# Patient Record
Sex: Female | Born: 1975 | Race: White | Hispanic: No | Marital: Married | Smoking: Current every day smoker
Health system: Southern US, Community
[De-identification: ages and names within clinical notes are randomized; demographics above are authoritative.]

## PROBLEM LIST (undated history)

## (undated) DIAGNOSIS — F329 Major depressive disorder, single episode, unspecified: Secondary | ICD-10-CM

## (undated) DIAGNOSIS — F32A Depression, unspecified: Secondary | ICD-10-CM

## (undated) DIAGNOSIS — F419 Anxiety disorder, unspecified: Secondary | ICD-10-CM

---

## 2007-05-29 ENCOUNTER — Ambulatory Visit: Payer: Self-pay | Admitting: Internal Medicine

## 2007-06-17 ENCOUNTER — Ambulatory Visit: Payer: Self-pay | Admitting: Urology

## 2011-05-04 ENCOUNTER — Ambulatory Visit: Payer: Self-pay | Admitting: Obstetrics and Gynecology

## 2014-05-20 ENCOUNTER — Ambulatory Visit: Payer: Self-pay | Admitting: Physician Assistant

## 2017-10-17 ENCOUNTER — Other Ambulatory Visit: Payer: Self-pay

## 2017-10-17 ENCOUNTER — Encounter: Payer: Self-pay | Admitting: Gynecology

## 2017-10-17 ENCOUNTER — Ambulatory Visit
Admission: EM | Admit: 2017-10-17 | Discharge: 2017-10-17 | Disposition: A | Discharge status: Home / Self Care | Payer: Federal, State, Local not specified - PPO | Attending: Family Medicine | Admitting: Family Medicine

## 2017-10-17 DIAGNOSIS — B9789 Other viral agents as the cause of diseases classified elsewhere: Secondary | ICD-10-CM

## 2017-10-17 DIAGNOSIS — J069 Acute upper respiratory infection, unspecified: Secondary | ICD-10-CM

## 2017-10-17 HISTORY — DX: Anxiety disorder, unspecified: F41.9

## 2017-10-17 HISTORY — DX: Major depressive disorder, single episode, unspecified: F32.9

## 2017-10-17 HISTORY — DX: Depression, unspecified: F32.A

## 2017-10-17 LAB — RAPID INFLUENZA A&B ANTIGENS
Influenza A (ARMC): NEGATIVE
Influenza B (ARMC): NEGATIVE

## 2017-10-17 NOTE — ED Provider Notes (Signed)
[facility]    CSN: [id number] Arrival date & time: 10/17/17  1607     History   Chief Complaint Chief Complaint  Patient presents with  . Cough    HPI Madison Odonnell is a 42 y.o. female.   The history is provided by the patient.  URI  Presenting symptoms: congestion, cough and rhinorrhea   Severity:  Moderate Onset quality:  Sudden Duration:  2 days Timing:  Constant Progression:  Unchanged Chronicity:  New Relieved by:  None tried Ineffective treatments:  None tried Associated symptoms: no headaches, no sinus pain and no wheezing   Risk factors: sick contacts   Risk factors: not elderly, no chronic cardiac disease, no chronic kidney disease, no chronic respiratory disease, no diabetes mellitus, no immunosuppression, no recent illness and no recent travel     Past Medical History:  Diagnosis Date  . Anxiety and depression     There are no active problems to display for this patient.   History reviewed. No pertinent surgical history.  OB History    No data available       Home Medications    Prior to Admission medications   Medication Sig Start Date End Date Taking? Authorizing Provider  benzonatate (TESSALON) 100 MG capsule Take by mouth 3 (three) times daily as needed for cough.   Yes [provider]  buPROPion (WELLBUTRIN) 100 MG tablet Take 100 mg by mouth 2 (two) times daily.   Yes [provider]    Family History Family History  Problem Relation Age of Onset  . Hypertension Mother   . Diabetes Mother   . Heart attack Mother     Social History Social History   Tobacco Use  . Smoking status: Current Every Day Smoker    Packs/day: 0.50  . Smokeless tobacco: Never Used  Substance Use Topics  . Alcohol use: Yes  . Drug use: No     Allergies   Patient has no known allergies.   Review of Systems Review of Systems  HENT: Positive for congestion and rhinorrhea. Negative for sinus pain.     Respiratory: Positive for cough. Negative for wheezing.   Neurological: Negative for headaches.     Physical Exam Triage Vital Signs ED Triage Vitals  Enc Vitals Group     BP 10/17/17 1622 124/60     Pulse Rate 10/17/17 1622 89     Resp --      Temp 10/17/17 1622 99.1 F (37.3 C)     Temp Source 10/17/17 1622 Oral     SpO2 10/17/17 1622 98 %     Weight 10/17/17 1627 132 lb (59.9 kg)     Height 10/17/17 1627 5\' 1"  (1.549 m)     Head Circumference --      Peak Flow --      Pain Score 10/17/17 1627 6     Pain Loc --      Pain Edu? --      Excl. in GC? --    No data found.  Updated Vital Signs BP 124/60 (BP Location: Left Arm)   Pulse 89   Temp 99.1 F (37.3 C) (Oral)   Ht 5\' 1"  (1.549 m)   Wt 132 lb (59.9 kg)   LMP 09/24/2017   SpO2 98%   BMI 24.94 kg/m   Visual Acuity Right Eye Distance:   Left Eye Distance:   Bilateral Distance:    Right Eye Near:   Left Eye Near:  Bilateral Near:     Physical Exam  Constitutional: She appears well-developed and well-nourished. No distress.  HENT:  Head: Normocephalic and atraumatic.  Right Ear: Tympanic membrane, external ear and ear canal normal.  Left Ear: Tympanic membrane, external ear and ear canal normal.  Nose: Rhinorrhea present. No mucosal edema, nose lacerations, sinus tenderness, nasal deformity, septal deviation or nasal septal hematoma. No epistaxis.  No foreign bodies. Right sinus exhibits no maxillary sinus tenderness and no frontal sinus tenderness. Left sinus exhibits no maxillary sinus tenderness and no frontal sinus tenderness.  Mouth/Throat: Uvula is midline, oropharynx is clear and moist and mucous membranes are normal. No oropharyngeal exudate.  Eyes: Conjunctivae are normal. Right eye exhibits no discharge. Left eye exhibits no discharge. No scleral icterus.  Neck: Normal range of motion. Neck supple. No thyromegaly present.  Cardiovascular: Normal rate, regular rhythm and normal heart sounds.   Pulmonary/Chest: Effort normal and breath sounds normal. No stridor. No respiratory distress. She has no wheezes. She has no rales.  Lymphadenopathy:    She has no cervical adenopathy.  Skin: She is not diaphoretic.  Nursing note and vitals reviewed.    UC Treatments / Results  Labs (all labs ordered are listed, but only abnormal results are displayed) Labs Reviewed  RAPID INFLUENZA A&B ANTIGENS ([facility] ONLY)    EKG  EKG Interpretation None       Radiology No results found.  Procedures Procedures (including critical care time)  Medications Ordered in UC Medications - No data to display   Initial Impression / Assessment and Plan / UC Course  I have reviewed the triage vital signs and the nursing notes.  Pertinent labs & imaging results that were available during my care of the patient were reviewed by me and considered in my medical decision making (see chart for details).       Final Clinical Impressions(s) / UC Diagnoses   Final diagnoses:  Viral URI with cough    ED Discharge Orders    None     1. Lab results and diagnosis reviewed with patient 2. rx as per orders above; reviewed possible side effects, interactions, risks and benefits  3. Recommend supportive treatment with rest, fluids, otc meds prn 4. Follow-up prn if symptoms worsen or don't improve  Controlled Substance Prescriptions [place] Controlled Substance Registry consulted? Not Applicable   Payton Mccallum, MD 10/17/17 [phone]

## 2017-10-17 NOTE — ED Triage Notes (Signed)
Patient c/o cough x 2 days ago.Per patient was seen for a sinus infection x 2 weeks ago. Per patient was given an antibiotic and cough  medication Benzoate 100 mg.

## 2018-10-11 ENCOUNTER — Encounter: Payer: Self-pay | Admitting: Podiatry

## 2018-10-11 ENCOUNTER — Ambulatory Visit: Payer: Federal, State, Local not specified - PPO | Admitting: Podiatry

## 2018-10-11 ENCOUNTER — Other Ambulatory Visit: Payer: Self-pay | Admitting: Podiatry

## 2018-10-11 ENCOUNTER — Ambulatory Visit (INDEPENDENT_AMBULATORY_CARE_PROVIDER_SITE_OTHER): Payer: Federal, State, Local not specified - PPO

## 2018-10-11 VITALS — BP 122/77 | HR 81

## 2018-10-11 DIAGNOSIS — M25571 Pain in right ankle and joints of right foot: Secondary | ICD-10-CM

## 2018-10-11 DIAGNOSIS — M659 Synovitis and tenosynovitis, unspecified: Secondary | ICD-10-CM

## 2018-10-11 DIAGNOSIS — M79671 Pain in right foot: Secondary | ICD-10-CM

## 2018-10-11 MED ORDER — MELOXICAM 15 MG PO TABS: 15.0000 mg | ORAL_TABLET | Freq: Every day | ORAL | 1 refills | Status: AC

## 2018-10-11 MED ORDER — METHYLPREDNISOLONE 4 MG PO TBPK: ORAL_TABLET | ORAL | 0 refills | Status: DC

## 2018-10-14 NOTE — Progress Notes (Signed)
   Subjective: Patient presents today for evaluation of intermittent pain to the medial border of the left hallux that began about two years ago. She reports associated thickening and discoloration of the nail. Patient is concerned for possible ingrown nail. She has not done anything for treatment. Wearing shoes and applying pressure to the toe increases the pain.  She also reports sharp, shooting, throbbing pain diffusely over the right foot that has been ongoing for the past few years. She has been elevating the foot for treatment with no significant relief. Walking and being on the foot increases the pain. Patient presents today for further treatment and evaluation.  Past Medical History:  Diagnosis Date  . Anxiety and depression     Objective:  General: Well developed, nourished, in no acute distress, alert and oriented x3   Dermatology: Skin is warm, dry and supple bilateral. Medial border of the left hallux appears to be erythematous with evidence of an ingrowing nail. Pain on palpation noted to the border of the nail fold. The remaining nails appear unremarkable at this time. There are no open sores, lesions.  Vascular: Dorsalis Pedis artery and Posterior Tibial artery pedal pulses palpable. No lower extremity edema noted.   Neruologic: Grossly intact via light touch bilateral.  Musculoskeletal: Pain with palpation noted to the anterior, lateral and medial aspects of the right ankle. Pain on palpation and range of motion to the sinus tarsi of the right foot. Muscular strength within normal limits in all groups bilateral. Normal range of motion noted to all pedal and ankle joints.   Radiographic Exam:  Normal osseous mineralization. Joint spaces preserved. No fracture/dislocation/boney destruction.    Assesement: #1 Paronychia with ingrowing nail medial border left hallux  #2 Right ankle synovitis  #3 right sinus tarsitis   Plan of Care:  1. Patient evaluated. X-Rays reviewed.    2. Injection of 0.5 mLs Celestone Soluspan injected into the right ankle joint.  3. Prescription for Medrol Dose Pak provided to patient. 4. Prescription for Meloxicam provided to patient. 5. CAM boot dispensed.  6. Mechanical debridement of the right great toenail performed using a nail nipper. Filed with dremel without incident. If ingrown not better, we will do a partial permanent nail avulsion procedure.  7. Return to clinic in 4 weeks.    Felecia Shelling, DPM [facility]  Dr. Felecia Shelling, DPM    [address]                                        [place], [place] [address]                Office [phone]  Fax [phone]

## 2018-11-08 ENCOUNTER — Encounter: Payer: Self-pay | Admitting: Podiatry

## 2018-11-08 ENCOUNTER — Ambulatory Visit (INDEPENDENT_AMBULATORY_CARE_PROVIDER_SITE_OTHER): Payer: Federal, State, Local not specified - PPO | Admitting: Podiatry

## 2018-11-08 ENCOUNTER — Other Ambulatory Visit: Payer: Self-pay

## 2018-11-08 DIAGNOSIS — M25571 Pain in right ankle and joints of right foot: Secondary | ICD-10-CM

## 2018-11-08 DIAGNOSIS — M659 Synovitis and tenosynovitis, unspecified: Secondary | ICD-10-CM | POA: Diagnosis not present

## 2018-11-12 NOTE — Progress Notes (Signed)
   Subjective: 43 year old female presenting today for follow up evaluation of right ankle pain. She states the pain is improving and is not as bad as it was previously. She has been using the CAM boot and taking Meloxicam as directed. There are no modifying factors noted. Patient is here for further evaluation and treatment.   Past Medical History:  Diagnosis Date  . Anxiety and depression     Objective:  General: Well developed, nourished, in no acute distress, alert and oriented x3   Dermatology: Skin is warm, dry and supple bilateral. There are no open sores, lesions.  Vascular: Dorsalis Pedis artery and Posterior Tibial artery pedal pulses palpable. No lower extremity edema noted.   Neruologic: Grossly intact via light touch bilateral.  Musculoskeletal: Pain with palpation noted to the anterior, lateral and medial aspects of the right ankle. Pain on palpation and range of motion to the sinus tarsi of the right foot. Muscular strength within normal limits in all groups bilateral. Normal range of motion noted to all pedal and ankle joints.   Assesement: #1 Paronychia with ingrowing nail medial border left hallux - resolved  #2 Right ankle synovitis  #3 right sinus tarsitis   Plan of Care:  1. Patient evaluated.   2. Injection of 0.5 mLs Celestone Soluspan injected into the lateral right ankle joint.  3. Continue taking Meloxicam.  4. Discontinue using CAM boot.  5. Ankle brace dispensed.  6. Return to clinic in 4 weeks. If not better, an MRI will be ordered.     Felecia Shelling, DPM [facility]  Dr. Felecia Shelling, DPM    [address]                                        [place], [place] [address]                Office [phone]  Fax [phone]

## 2018-12-27 ENCOUNTER — Encounter: Payer: Self-pay | Admitting: Podiatry

## 2018-12-27 ENCOUNTER — Ambulatory Visit: Payer: Federal, State, Local not specified - PPO | Admitting: Podiatry

## 2018-12-27 ENCOUNTER — Other Ambulatory Visit: Payer: Self-pay | Admitting: Podiatry

## 2018-12-27 ENCOUNTER — Ambulatory Visit (INDEPENDENT_AMBULATORY_CARE_PROVIDER_SITE_OTHER): Payer: Federal, State, Local not specified - PPO

## 2018-12-27 ENCOUNTER — Other Ambulatory Visit: Payer: Self-pay

## 2018-12-27 VITALS — Temp 98.1°F

## 2018-12-27 DIAGNOSIS — M84374A Stress fracture, right foot, initial encounter for fracture: Secondary | ICD-10-CM | POA: Diagnosis not present

## 2018-12-27 DIAGNOSIS — M659 Synovitis and tenosynovitis, unspecified: Secondary | ICD-10-CM

## 2018-12-27 DIAGNOSIS — M79671 Pain in right foot: Secondary | ICD-10-CM

## 2018-12-27 DIAGNOSIS — M25571 Pain in right ankle and joints of right foot: Secondary | ICD-10-CM

## 2018-12-27 MED ORDER — CYCLOBENZAPRINE HCL 5 MG PO TABS: 5.0000 mg | ORAL_TABLET | Freq: Three times a day (TID) | ORAL | 1 refills | Status: AC | PRN

## 2019-01-14 NOTE — Progress Notes (Signed)
   Subjective: 43 year old female presenting today for follow up evaluation of right ankle pain.  Patient continues to report some right ankle pain.  It is not constant but intermittently painful.  She says there is improvement since last visit.  The brace really helps.   Past Medical History:  Diagnosis Date  . Anxiety and depression     Objective:  General: Well developed, nourished, in no acute distress, alert and oriented x3   Dermatology: Skin is warm, dry and supple bilateral. There are no open sores, lesions.  Vascular: Dorsalis Pedis artery and Posterior Tibial artery pedal pulses palpable. No lower extremity edema noted.   Neruologic: Grossly intact via light touch bilateral.  Musculoskeletal: Pain with palpation noted to the anterior, lateral and medial aspects of the right ankle. Pain on palpation and range of motion to the sinus tarsi of the right foot. Muscular strength within normal limits in all groups bilateral. Normal range of motion noted to all pedal and ankle joints.   Assesement: #1 Paronychia with ingrowing nail medial border left hallux - resolved  #2 Right ankle synovitis-improved #3 right sinus tarsitis  #4 5th metatarsal stress fracture #5 cramping right foot  Plan of Care:  1. Patient evaluated.   2. Injection of 0.5 mLs Celestone Soluspan injected into the sinus tarsi right 3. Continue taking Meloxicam.  4.  Prescription for Flexeril 5 mg nightly 5.  Continue wearing the ankle brace 6.  Return to clinic in 4 weeks   Felecia Shelling, DPM [facility]  Dr. Felecia Shelling, DPM    [address]                                        [place], [place] [address]                Office [phone]  Fax [phone]

## 2019-01-24 ENCOUNTER — Encounter: Payer: Self-pay | Admitting: Podiatry

## 2019-01-24 ENCOUNTER — Ambulatory Visit: Payer: Self-pay

## 2019-01-24 ENCOUNTER — Ambulatory Visit (INDEPENDENT_AMBULATORY_CARE_PROVIDER_SITE_OTHER): Payer: Federal, State, Local not specified - PPO | Admitting: Podiatry

## 2019-01-24 ENCOUNTER — Other Ambulatory Visit: Payer: Self-pay

## 2019-01-24 ENCOUNTER — Other Ambulatory Visit: Payer: Self-pay | Admitting: Podiatry

## 2019-01-24 VITALS — Temp 97.8°F | Resp 16

## 2019-01-24 DIAGNOSIS — M84374A Stress fracture, right foot, initial encounter for fracture: Secondary | ICD-10-CM | POA: Diagnosis not present

## 2019-01-24 DIAGNOSIS — M79671 Pain in right foot: Secondary | ICD-10-CM | POA: Diagnosis not present

## 2019-01-24 DIAGNOSIS — M659 Synovitis and tenosynovitis, unspecified: Secondary | ICD-10-CM | POA: Diagnosis not present

## 2019-01-24 DIAGNOSIS — M25571 Pain in right ankle and joints of right foot: Secondary | ICD-10-CM | POA: Diagnosis not present

## 2019-01-27 NOTE — Progress Notes (Signed)
   HPI: 43 year old female presenting today for follow up evaluation of right foot pain. She states the pain has not changed since her last visit. She reports moderate dull, shooting pain. She has been using the ankle brace and taking Meloxicam and Flexeril for treatment. There are no modifying factors noted. Patient is here for further evaluation and treatment.   Past Medical History:  Diagnosis Date  . Anxiety and depression      Physical Exam: General: The patient is alert and oriented x3 in no acute distress.  Dermatology: Skin is warm, dry and supple bilateral lower extremities. Negative for open lesions or macerations.  Vascular: Palpable pedal pulses bilaterally. No edema or erythema noted. Capillary refill within normal limits.  Neurological: Epicritic and protective threshold grossly intact bilaterally.   Musculoskeletal Exam: Pain with palpation noted to the 5th metatarsal of the right foot. Range of motion within normal limits to all pedal and ankle joints bilateral. Muscle strength 5/5 in all groups bilateral.   Radiographic Exam:  Normal osseous mineralization. Joint spaces preserved. No fracture/dislocation/boney destruction.    Assessment: 1. 5th metatarsal stress fracture right 2. Cramping right foot   Plan of Care:  1. Patient evaluated. X-Rays reviewed.  2. Order for MRI right foot placed.  3. Continue taking Meloxicam.  4. Return to clinic in 3 weeks to review MRI.      Edrick Kins, DPM [facility]  Dr. Edrick Kins, DPM    [address], [place] [address]                Office [phone]  Fax [phone]

## 2019-01-31 ENCOUNTER — Telehealth: Payer: Self-pay

## 2019-01-31 DIAGNOSIS — M25571 Pain in right ankle and joints of right foot: Secondary | ICD-10-CM

## 2019-01-31 DIAGNOSIS — M84374A Stress fracture, right foot, initial encounter for fracture: Secondary | ICD-10-CM

## 2019-01-31 NOTE — Telephone Encounter (Signed)
Per Dewayne Shorter ([employer]), no precert is required for outpatient services.    Patient has been notified via voice mail and informed to call scheduling to set up own appt at her convenience.

## 2019-01-31 NOTE — Telephone Encounter (Signed)
-----   Message from Edrick Kins, DPM sent at 01/24/2019  1:49 PM EDT ----- Regarding: MRI RT foot Please order MRI RT foot.   Dx: sinus tarsitis RT. 5th metatarsal pain right.

## 2019-02-14 ENCOUNTER — Ambulatory Visit: Payer: Federal, State, Local not specified - PPO | Admitting: Podiatry

## 2019-02-16 ENCOUNTER — Ambulatory Visit
Admission: RE | Admit: 2019-02-16 | Discharge: 2019-02-16 | Disposition: A | Discharge status: Home / Self Care | Payer: Federal, State, Local not specified - PPO | Source: Ambulatory Visit | Attending: Podiatry | Admitting: Podiatry

## 2019-02-16 ENCOUNTER — Other Ambulatory Visit: Payer: Self-pay

## 2019-02-16 DIAGNOSIS — M84374A Stress fracture, right foot, initial encounter for fracture: Secondary | ICD-10-CM | POA: Diagnosis present

## 2019-02-16 DIAGNOSIS — M25571 Pain in right ankle and joints of right foot: Secondary | ICD-10-CM | POA: Insufficient documentation

## 2019-02-25 ENCOUNTER — Encounter: Payer: Self-pay | Admitting: Podiatry

## 2019-02-25 ENCOUNTER — Other Ambulatory Visit: Payer: Self-pay

## 2019-02-25 ENCOUNTER — Ambulatory Visit (INDEPENDENT_AMBULATORY_CARE_PROVIDER_SITE_OTHER): Payer: Federal, State, Local not specified - PPO | Admitting: Podiatry

## 2019-02-25 VITALS — Temp 97.3°F

## 2019-02-25 DIAGNOSIS — G5791 Unspecified mononeuropathy of right lower limb: Secondary | ICD-10-CM | POA: Diagnosis not present

## 2019-02-25 MED ORDER — GABAPENTIN 100 MG PO CAPS: 100.0000 mg | ORAL_CAPSULE | Freq: Three times a day (TID) | ORAL | 1 refills | Status: DC

## 2019-02-27 NOTE — Progress Notes (Signed)
   HPI: 43 y.o. female presenting today for follow up evaluation of a stress fracture of the 5th metatarsal of the right foot. She reports continued pain that is more constant today. There are no modifying factors noted. She has been taking Meloxicam for treatment. Patient is here for further evaluation and treatment.   Past Medical History:  Diagnosis Date  . Anxiety and depression      Physical Exam: General: The patient is alert and oriented x3 in no acute distress.  Dermatology: Skin is warm, dry and supple bilateral lower extremities. Negative for open lesions or macerations.  Vascular: Palpable pedal pulses bilaterally. No edema or erythema noted. Capillary refill within normal limits.  Neurological: Epicritic and protective threshold intact.  Paresthesia with burning, shooting pain noted to the dorsolateral right foot.   Musculoskeletal Exam: Positive Tinel sign with pain on palpation noted to the right dorsolateral foot. Range of motion within normal limits to all pedal and ankle joints bilateral. Muscle strength 5/5 in all groups bilateral.   MRI Impression:    1. 3.4 cm ganglion cyst in the dorsolateral midfoot.  Assessment: 1. Neuritis right dorsolateral foot 2. Ganglion cyst right as per MRI   Plan of Care:  1. Patient evaluated. MRI reviewed.  2. Injection of 0.5 mLs Celestone Soluspan injected into the right dorsolateral foot.  3. Prescription for Gabapentin 100 mg TID provided to patient.  4. Recommended good shoe gear.  5. Return to clinic in 4 weeks.      Edrick Kins, DPM [facility]  Dr. Edrick Kins, DPM    [address], [place] [address]                Office [phone]  Fax [phone]

## 2019-03-19 ENCOUNTER — Other Ambulatory Visit: Payer: Self-pay | Admitting: Podiatry

## 2019-03-28 ENCOUNTER — Encounter: Payer: Self-pay | Admitting: Podiatry

## 2019-03-28 ENCOUNTER — Ambulatory Visit: Payer: Federal, State, Local not specified - PPO | Admitting: Podiatry

## 2019-03-28 ENCOUNTER — Other Ambulatory Visit: Payer: Self-pay

## 2019-03-28 VITALS — Temp 97.8°F

## 2019-03-28 DIAGNOSIS — M25571 Pain in right ankle and joints of right foot: Secondary | ICD-10-CM | POA: Diagnosis not present

## 2019-03-28 DIAGNOSIS — G5791 Unspecified mononeuropathy of right lower limb: Secondary | ICD-10-CM

## 2019-03-30 NOTE — Progress Notes (Signed)
   HPI: 43 y.o. female presenting today for follow up evaluation of neuritis and a ganglion cyst of the right foot. She states she is still experiencing some pain but it is not as severe as it was previously. She has been taking Gabapentin daily for treatment. There are no modifying factors noted. Patient is here for further evaluation and treatment.   Past Medical History:  Diagnosis Date  . Anxiety and depression      Physical Exam: General: The patient is alert and oriented x3 in no acute distress.  Dermatology: Skin is warm, dry and supple bilateral lower extremities. Negative for open lesions or macerations.  Vascular: Palpable pedal pulses bilaterally. No edema or erythema noted. Capillary refill within normal limits.  Neurological: Epicritic and protective threshold intact.  Paresthesia with burning, shooting pain noted to the dorsolateral right foot.   Musculoskeletal Exam: Positive Tinel sign with pain on palpation noted to the right dorsolateral foot. Range of motion within normal limits to all pedal and ankle joints bilateral. Muscle strength 5/5 in all groups bilateral.   Assessment: 1. Neuritis right dorsolateral foot - improved  2. Ganglion cyst right as per MRI - improved    Plan of Care:  1. Patient evaluated.  2. Continue taking Gabapentin daily.  3. Continue wearing good shoe gear.  4. Patient declined topical antiinflammatory prescription.  5. Return to clinic as needed.      Edrick Kins, DPM [facility]  Dr. Edrick Kins, DPM    [address], [place] [address]                Office [phone]  Fax [phone]

## 2019-05-15 ENCOUNTER — Ambulatory Visit
Admission: EM | Admit: 2019-05-15 | Discharge: 2019-05-15 | Disposition: A | Discharge status: Home / Self Care | Payer: Federal, State, Local not specified - PPO | Attending: Family Medicine | Admitting: Family Medicine

## 2019-05-15 ENCOUNTER — Encounter: Payer: Self-pay | Admitting: Emergency Medicine

## 2019-05-15 ENCOUNTER — Other Ambulatory Visit: Payer: Self-pay

## 2019-05-15 DIAGNOSIS — N390 Urinary tract infection, site not specified: Secondary | ICD-10-CM

## 2019-05-15 DIAGNOSIS — N760 Acute vaginitis: Secondary | ICD-10-CM | POA: Diagnosis not present

## 2019-05-15 DIAGNOSIS — B9689 Other specified bacterial agents as the cause of diseases classified elsewhere: Secondary | ICD-10-CM | POA: Diagnosis not present

## 2019-05-15 LAB — URINALYSIS, COMPLETE (UACMP) WITH MICROSCOPIC
Bilirubin Urine: NEGATIVE
Glucose, UA: NEGATIVE mg/dL
Nitrite: NEGATIVE
Protein, ur: NEGATIVE mg/dL
Specific Gravity, Urine: 1.01 (ref 1.005–1.030)
WBC, UA: >50 WBC/hpf (ref 0–5)
pH: 7 (ref 5.0–8.0)

## 2019-05-15 LAB — WET PREP, GENITAL
Sperm: NONE SEEN
Trich, Wet Prep: NONE SEEN
Yeast Wet Prep HPF POC: NONE SEEN

## 2019-05-15 MED ORDER — METRONIDAZOLE 500 MG PO TABS: 500.0000 mg | ORAL_TABLET | Freq: Two times a day (BID) | ORAL | 0 refills | Status: AC

## 2019-05-15 MED ORDER — PHENAZOPYRIDINE HCL 200 MG PO TABS: 200.0000 mg | ORAL_TABLET | Freq: Three times a day (TID) | ORAL | 0 refills | Status: AC | PRN

## 2019-05-15 MED ORDER — SULFAMETHOXAZOLE-TRIMETHOPRIM 800-160 MG PO TABS: 1.0000 | ORAL_TABLET | Freq: Two times a day (BID) | ORAL | 0 refills | Status: AC

## 2019-05-15 NOTE — ED Provider Notes (Signed)
[place], [place]   Name: Madison Murrayrin Macallum Cutshaw DOB: [birth year] MRN: [id number] CSN: [id number] PCP: Jenell MillinerLuyando, Yvonne, MD  Arrival date and time:  05/15/19 1310  Chief Complaint:  Urinary symptoms and vaginal discharge  NOTE: Prior to seeing the patient today, I have reviewed the triage nursing documentation and vital signs. Clinical staff has updated patient's PMH/PSHx, current medication list, and drug allergies/intolerances to ensure comprehensive history available to assist in medical decision making.   History:   HPI: Madison Odonnell is a 43 y.o. female who presents today with complaints of urinary symptoms that began last week when she mainly had urgency. Patient reports that she drank lots of water and cranberry juice, which abated her symptoms. Yesterday, patient developed a recurrence of her symptoms. She complains of dysuria, frequency, and urgency. She has not appreciated any gross hematuria, nor has she noticed her urine being malodorous. Patient denies any associated nausea, vomiting, fever, and chills. She has not experienced any pain in her lower back, flank area, or abdomen. Patient advises that she has a significant history for recurrent urinary tract infections. She denies any vaginal pain or bleeding. She does, however report white vaginal discharge with no associated odor. There are no concerns that she is currently pregnant as she is perimenopausal.   Past Medical History:  Diagnosis Date  . Anxiety and depression     History reviewed. No pertinent surgical history.  Family History  Problem Relation Age of Onset  . Hypertension Mother   . Diabetes Mother   . Heart attack Mother     Social History   Tobacco Use  . Smoking status: Current Every Day Smoker    Packs/day: 0.50  . Smokeless tobacco: Never Used  Substance Use Topics  . Alcohol use: Yes  . Drug use: No    There are no active problems to display for this patient.   Home Medications:    Current  Meds  Medication Sig  . buPROPion (WELLBUTRIN) 100 MG tablet Take 100 mg by mouth 2 (two) times daily.  Marland Kitchen. gabapentin (NEURONTIN) 100 MG capsule TAKE 1 CAPSULE BY MOUTH THREE TIMES A DAY    Allergies:   Patient has no known allergies.  Review of Systems (ROS): Review of Systems  Constitutional: Negative for chills and fever.  Respiratory: Negative for cough and shortness of breath.   Cardiovascular: Negative for chest pain and palpitations.  Gastrointestinal: Negative for abdominal pain, nausea and vomiting.  Genitourinary: Positive for dysuria, frequency, urgency and vaginal discharge. Negative for decreased urine volume, hematuria, pelvic pain, vaginal bleeding and vaginal pain.  Musculoskeletal: Negative for back pain.  Skin: Negative for color change, pallor and rash.  Neurological: Negative for dizziness, syncope, weakness and headaches.  All other systems reviewed and are negative.    Vital Signs: Today's Vitals   05/15/19 1331 05/15/19 1336 05/15/19 1447  BP:  105/64   Pulse:  71   Resp:  18   Temp:  98.4 F (36.9 C)   SpO2:  100%   Height: 5\' 1"  (1.549 m)    PainSc: 7   4     Physical Exam: Physical Exam  Constitutional: She is oriented to person, place, and time and well-developed, well-nourished, and in no distress.  HENT:  Head: Normocephalic and atraumatic.  Mouth/Throat: Mucous membranes are normal.  Eyes: Pupils are equal, round, and reactive to light.  Neck: Normal range of motion. Neck supple.  Cardiovascular: Normal rate, regular rhythm, normal heart sounds and intact  distal pulses. Exam reveals no gallop and no friction rub.  No murmur heard. Pulmonary/Chest: Effort normal and breath sounds normal. No respiratory distress. She has no wheezes. She has no rales.  Abdominal: Soft. Normal appearance and bowel sounds are normal. There is abdominal tenderness in the suprapubic area. There is no CVA tenderness.  Genitourinary:    Genitourinary Comments: Exam  deferred. No vaginal/pelvic pain or bleeding. Patient is not currently pregnant. She has elected to self collect specimen swab for wet prep.   Neurological: She is alert and oriented to person, place, and time. Gait normal.  Skin: Skin is warm and dry. No rash noted.  Psychiatric: Mood, memory, affect and judgment normal.  Nursing note and vitals reviewed.   Urgent Care Treatments / Results:   LABS: PLEASE NOTE: all labs that were ordered this encounter are listed, however only abnormal results are displayed. Labs Reviewed  WET PREP, GENITAL - Abnormal; Notable for the following components:      Result Value   Clue Cells Wet Prep HPF POC PRESENT (*)    WBC, Wet Prep HPF POC FEW (*)    All other components within normal limits  URINALYSIS, COMPLETE (UACMP) WITH MICROSCOPIC - Abnormal; Notable for the following components:   APPearance CLOUDY (*)    Hgb urine dipstick SMALL (*)    Ketones, ur TRACE (*)    Leukocytes,Ua MODERATE (*)    Bacteria, UA RARE (*)    All other components within normal limits  URINE CULTURE    EKG: -None  RADIOLOGY: No results found.  PROCEDURES: Procedures  MEDICATIONS RECEIVED THIS VISIT: Medications - No data to display  PERTINENT CLINICAL COURSE NOTES/UPDATES:   Initial Impression / Assessment and Plan / Urgent Care Course:  Pertinent labs & imaging results that were available during my care of the patient were personally reviewed by me and considered in my medical decision making (see lab/imaging section of note for values and interpretations).  Madison Odonnell is a 43 y.o. female who presents to [facility] today with complaints of urinary symptoms and vaginal discharge.   Patient is well appearing overall in clinic today. She does not appear to be in any acute distress. Presenting symptoms (see HPI) and exam as documented above. Wet prep (+) for clue cells, which is consistent with a diagnosis of bacterial vaginosis (BV). Will  prescribe a 7 day course of oral metronidazole. Patient educated on need to complete full course of this medication even if feeling better. Reviewed need to avoid alcohol while on this medication, as concurrent use can cause a disulfiram like reaction, which in turn will cause her to experience significant nausea and vomiting.   UA was (+) for infection; reflex culture sent. Will treat with a 5 day course of Bactrim. Patient encouraged to complete the entire course of antibiotics even if she begins to feel better. She was advised that if culture demonstrates resistance to the prescribed antibiotic, she will be contacted and advised of the need to change the antibiotic being used to treat her infection. Patient encouraged to increase her fluid intake as much as possible. Discussed that water is always best to flush the urinary tract. She was advised to avoid caffeine containing fluids until her infections clears, as caffeine can cause her to experience painful bladder spasms. May use Tylenol and/or Ibuprofen as needed for pain/fever. Rx sent in for phenazopyridine to help relieve her current urinary pain.   Discussed follow up with primary care physician  in 1 week for re-evaluation. I have reviewed the follow up and strict return precautions for any new or worsening symptoms. Patient is aware of symptoms that would be deemed urgent/emergent, and would thus require further evaluation either here or in the emergency department. At the time of discharge, she verbalized understanding and consent with the discharge plan as it was reviewed with her. All questions were fielded by provider and/or clinic staff prior to patient discharge.    Final Clinical Impressions / Urgent Care Diagnoses:   Final diagnoses:  Urinary tract infection without hematuria, site unspecified  BV (bacterial vaginosis)    New Prescriptions:  [place] Controlled Substance Registry consulted? Not Applicable  Meds ordered this encounter   Medications  . sulfamethoxazole-trimethoprim (BACTRIM DS) 800-160 MG tablet    Sig: Take 1 tablet by mouth 2 (two) times daily for 5 days.    Dispense:  10 tablet    Refill:  0  . metroNIDAZOLE (FLAGYL) 500 MG tablet    Sig: Take 1 tablet (500 mg total) by mouth 2 (two) times daily.    Dispense:  14 tablet    Refill:  0  . phenazopyridine (PYRIDIUM) 200 MG tablet    Sig: Take 1 tablet (200 mg total) by mouth 3 (three) times daily as needed for pain.    Dispense:  9 tablet    Refill:  0    Recommended Follow up Care:  Patient encouraged to follow up with the following provider within the specified time frame, or sooner as dictated by the severity of her symptoms. As always, she was instructed that for any urgent/emergent care needs, she should seek care either here or in the emergency department for more immediate evaluation.  Follow-up Information    Danae Orleans, MD In 1 week.   Specialty: Internal Medicine Why: General reassessment of symptoms if not improving Contact information: [address] [facility] [place] [address] [phone]         NOTE: This note was prepared using Dragon dictation software along with smaller phrase technology. Despite my best ability to proofread, there is the potential that transcriptional errors may still occur from this process, and are completely unintentional.   Karen Kitchens, NP 05/16/19 2119

## 2019-05-15 NOTE — ED Triage Notes (Signed)
Patient here today? UTI haad burning this morning and yesterday discharge(white) Burt Ek. Last wk patient stated that she  had urgency drunk plenty of  cranberrry juice and water went away.

## 2019-05-15 NOTE — Discharge Instructions (Addendum)
It was very nice seeing you today in clinic. Thank you for entrusting me with your care.   As discussed, your urine and vaginal swab were POSITIVE for infection. Will approach treatment as follows:  Prescription has been sent to your pharmacy for antibiotics.  Please pick up and take as directed. FINISH the entire course of medication even if you are feeling better.  A culture will be sent on your provided sample. If it comes back resistant to what I have prescribed you, someone will call you and let you know that we will need to change antibiotics. Increase fluid intake as much as possible to flush your urinary tract.  Water is always the best.  Avoid caffeine until your infection clears up, as it can contribute to painful bladder spasms.  May use Tylenol and/or Ibuprofen as needed for pain/fever.  Make arrangements to follow up with your regular doctor in 1 week for re-evaluation. If your symptoms/condition worsens, please seek follow up care either here or in the ER. Please remember, our [place] providers are "right here with you" when you need [place].   Again, it was my pleasure to take care of you today. Thank you for choosing our clinic. I hope that you start to feel better quickly.   Honor Loh, MSN, APRN, FNP-C, CEN Advanced Practice Provider [facility]

## 2019-05-17 LAB — URINE CULTURE: Culture: 50000 — AB

## 2019-05-19 ENCOUNTER — Telehealth (HOSPITAL_COMMUNITY): Payer: Self-pay | Admitting: Emergency Medicine

## 2019-05-19 NOTE — Telephone Encounter (Signed)
Urine culture was positive for e coli and was given bactrim  at urgent care visit.. Attempted to reach patient. No answer at this time.   

## 2019-07-08 ENCOUNTER — Other Ambulatory Visit: Payer: Self-pay

## 2019-07-08 DIAGNOSIS — Z20822 Contact with and (suspected) exposure to covid-19: Secondary | ICD-10-CM

## 2019-07-10 LAB — NOVEL CORONAVIRUS, NAA: SARS-CoV-2, NAA: NOT DETECTED

## 2020-11-13 IMAGING — MR MRI OF THE RIGHT FOREFOOT WITHOUT CONTRAST
5 series · 40 of 40 positions shown · non-contrast
Comparison: Right foot x-rays dated January 24, 2019.

CLINICAL DATA: Intermittent lateral right foot pain for the past
year.

EXAM:
MRI OF THE RIGHT FOREFOOT WITHOUT CONTRAST
TECHNIQUE: Multiplanar, multisequence MR imaging of the right foot was
performed. No intravenous contrast was administered.

[Series 3: T1 · coronal · right · 3.0mm · 0.38mm/px · 10 of 45 slices shown (1 of 2)]
[im 1/45]
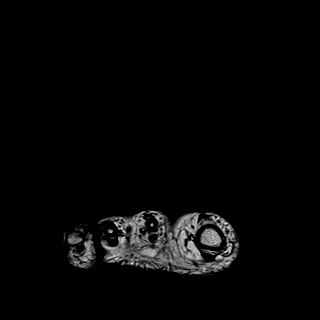
[im 5/45]
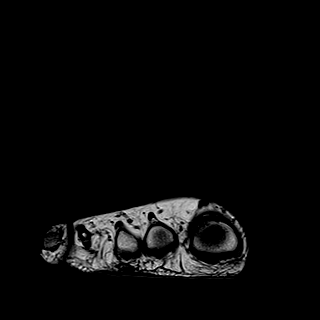
[im 10/45]
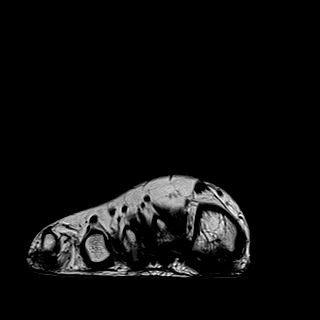
[im 15/45]
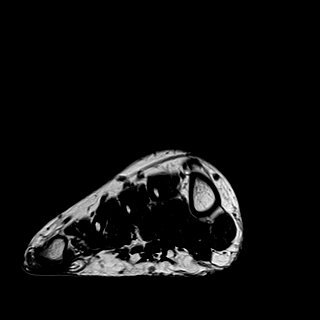
[im 20/45]
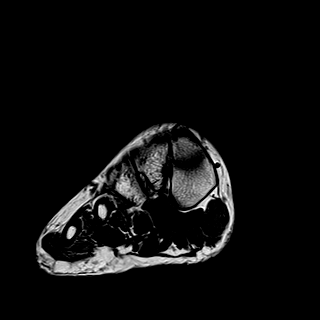
[im 25/45]
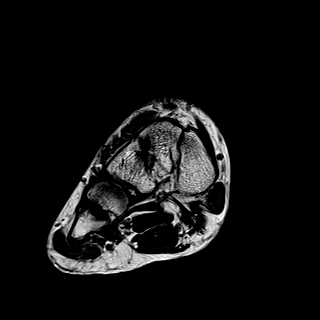
[im 30/45]
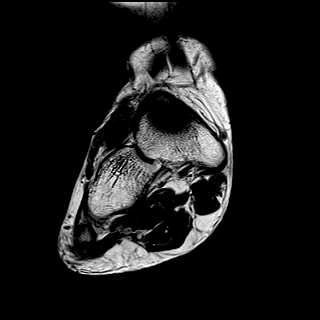
[im 35/45]
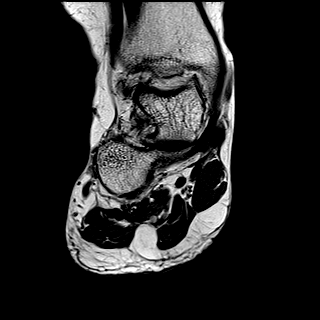
[im 40/45]
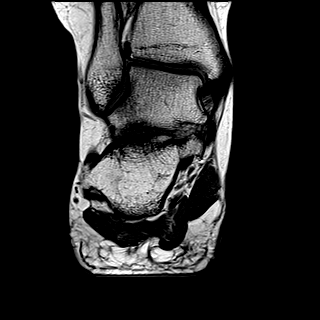
[im 45/45]
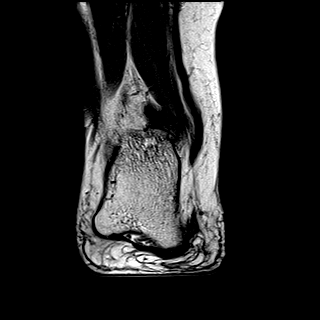

[Series 5: T2 · coronal · right · 3.0mm · 0.38mm/px · 11 of 45 slices shown (1 of 2)]
[im 1/45]
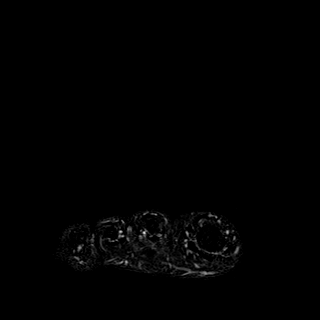
[im 5/45]
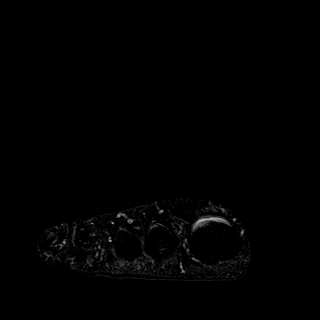
[im 9/45]
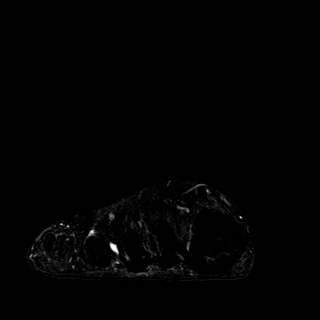
[im 14/45]
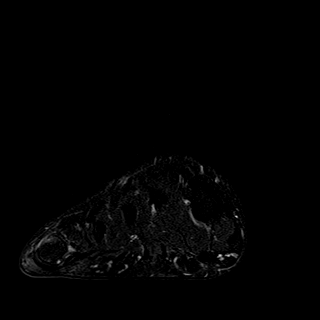
[im 18/45]
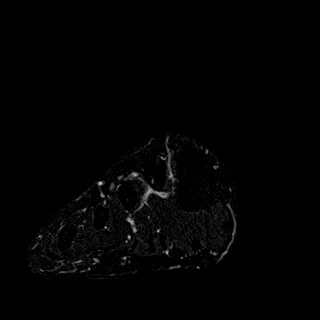
[im 23/45]
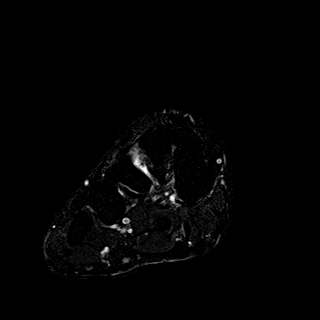
[im 27/45]
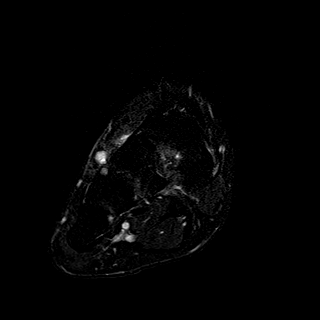
[im 31/45]
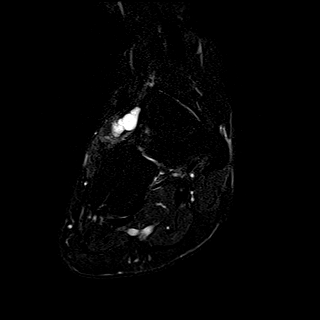
[im 36/45]
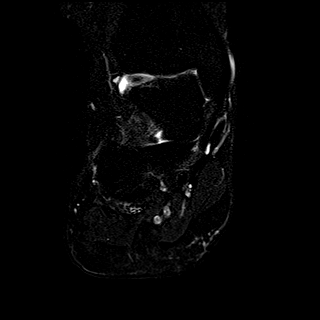
[im 40/45]
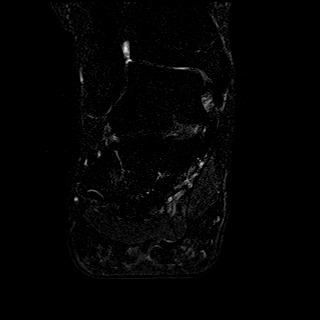
[im 45/45]
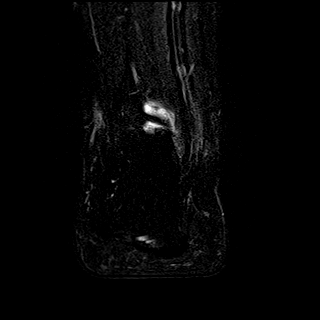

[Series 6: T1 · axial · right · 3.0mm · 0.70mm/px · z∈[-121,-36]mm · 6 of 24 slices shown (2 of 2)]
[im 1/24]
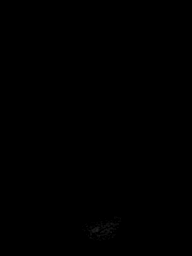
[im 5/24]
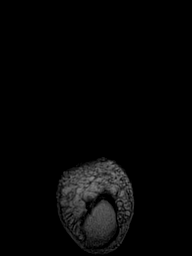
[im 10/24]
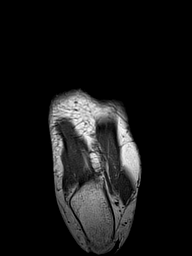
[im 14/24]
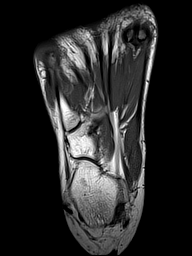
[im 19/24]
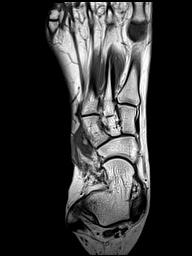
[im 24/24]
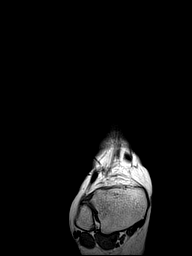

[Series 8: T2 · axial · right · 3.0mm · 0.70mm/px · z∈[-121,-36]mm · 6 of 23 slices shown (2 of 2)]
[im 1/23]
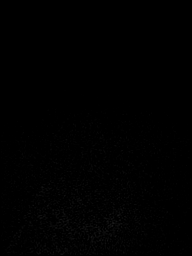
[im 5/23]
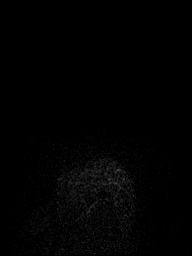
[im 9/23]
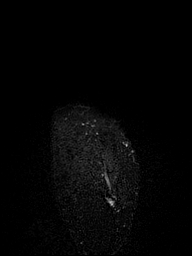
[im 14/23]
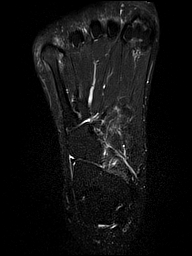
[im 18/23]
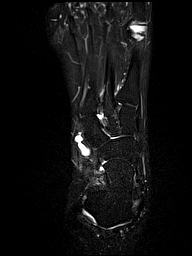
[im 23/23]
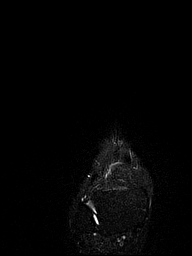

[Series 9: STIR · sagittal · right · 3.0mm · 0.62mm/px · 7 of 28 slices shown]
[im 1/28]
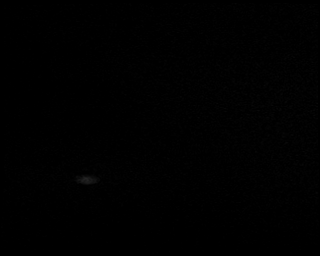
[im 5/28]
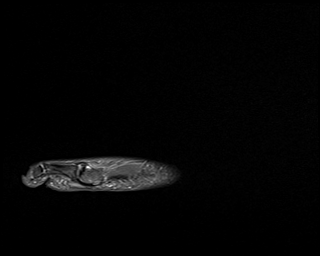
[im 10/28]
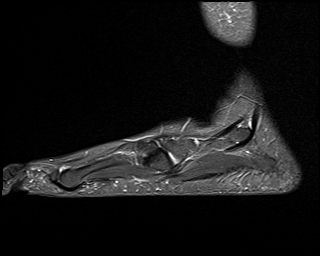
[im 14/28]
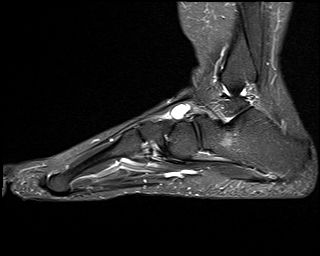
[im 19/28]
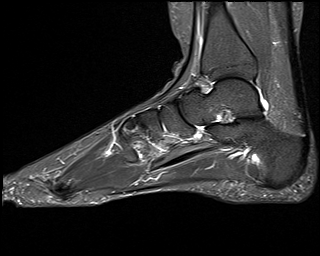
[im 23/28]
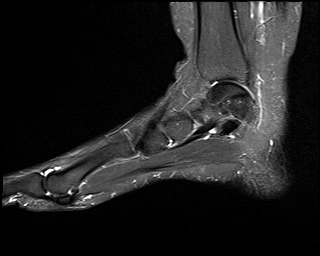
[im 28/28]
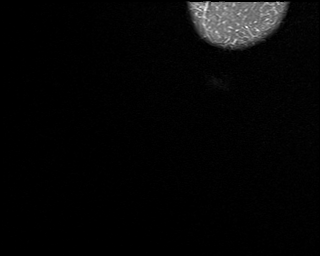

[40 of 40 positions shown; findings below may reference images not displayed]

FINDINGS: Bones/Joint/Cartilage

No marrow signal abnormality. No fracture or dislocation. Normal
alignment. No joint effusion.

Ligaments

Collateral ligaments are intact.  Lisfranc ligament is intact.

Muscles and Tendons
Flexor, peroneal and extensor compartment tendons are intact. No
muscle edema or atrophy.

Soft tissue

There is a 3.4 x 1.0 x 0.9 cm ganglion cyst in the dorsolateral
midfoot extending from the talonavicular joint to the fourth
tarsometatarsal joint. This may arise from the fourth
tarsometatarsal joint (series 9, image 13). The sinus tarsi is
unremarkable.
IMPRESSION: 1. 3.4 cm ganglion cyst in the dorsolateral midfoot.

## 2022-01-20 ENCOUNTER — Encounter: Payer: Self-pay | Admitting: Podiatry

## 2022-01-24 ENCOUNTER — Ambulatory Visit (INDEPENDENT_AMBULATORY_CARE_PROVIDER_SITE_OTHER): Payer: Federal, State, Local not specified - PPO

## 2022-01-24 ENCOUNTER — Ambulatory Visit: Payer: Federal, State, Local not specified - PPO | Admitting: Podiatry

## 2022-01-24 DIAGNOSIS — L723 Sebaceous cyst: Secondary | ICD-10-CM

## 2022-01-24 DIAGNOSIS — M778 Other enthesopathies, not elsewhere classified: Secondary | ICD-10-CM | POA: Diagnosis not present

## 2022-01-24 MED ORDER — MELOXICAM 15 MG PO TABS: 15.0000 mg | ORAL_TABLET | Freq: Every day | ORAL | 1 refills | Status: AC

## 2022-01-24 NOTE — Progress Notes (Signed)
   HPI: 46 y.o. female presenting today for new complaint of pain to the right foot.  Patient states that she went to a preschool graduation and noticed increased pain and tenderness that day.  By the end of the day she did notice swelling and tenderness to the right foot dorsal aspect.  She denies injury.  She says that this occurred about 3 weeks ago and over the past 3 weeks there has been improvement.  The swelling has resolved.  She continues to have some slight tenderness to the area.  She presents for further treatment and evaluation  Past Medical History:  Diagnosis Date   Anxiety and depression     No past surgical history on file.  No Known Allergies   Physical Exam: General: The patient is alert and oriented x3 in no acute distress.  Dermatology: Skin is warm, dry and supple bilateral lower extremities. Negative for open lesions or macerations.  There is a small focal bruise overlying the distal portion of the fourth metatarsal.  The patient was unaware.  Associated tenderness to palpation in this area  Vascular: Palpable pedal pulses bilaterally. Capillary refill within normal limits.  Negative for any significant edema or erythema  Neurological: Light touch and protective threshold grossly intact  Musculoskeletal Exam: No pedal deformities noted.  Pain on palpation noted most focused around the contusion to the dorsal aspect of the right forefoot overlying the fourth metatarsal.  Radiographic Exam:  Normal osseous mineralization. Joint spaces preserved. No fracture/dislocation/boney destruction.    Assessment: 1.  Contusion right forefoot 2.  Capsulitis right foot   Plan of Care:  1. Patient evaluated. X-Rays reviewed.  2.  Fortunately the patient has had improvement over the past 3 weeks.  We will simply continue some conservative treatment for now 3.  Continue good supportive shoes and sneakers.  I did recommend OTC arch supports.  Patient cannot afford custom molded  orthotics 4.  Prescription for meloxicam 15 mg daily 5.  Return to clinic as needed      Felecia Shelling, DPM [facility]  Dr. Felecia Shelling, DPM    [address] [place], [place] [address]                Office [phone]  Fax [phone]

## 2022-09-04 ENCOUNTER — Ambulatory Visit
Admission: EM | Admit: 2022-09-04 | Discharge: 2022-09-04 | Disposition: A | Discharge status: Home / Self Care | Payer: Federal, State, Local not specified - PPO | Attending: Physician Assistant | Admitting: Physician Assistant

## 2022-09-04 DIAGNOSIS — G43809 Other migraine, not intractable, without status migrainosus: Secondary | ICD-10-CM | POA: Diagnosis not present

## 2022-09-04 DIAGNOSIS — L02214 Cutaneous abscess of groin: Secondary | ICD-10-CM

## 2022-09-04 MED ORDER — SULFAMETHOXAZOLE-TRIMETHOPRIM 800-160 MG PO TABS: 1.0000 | ORAL_TABLET | Freq: Two times a day (BID) | ORAL | 0 refills | Status: AC

## 2022-09-04 MED ORDER — KETOROLAC TROMETHAMINE 60 MG/2ML IM SOLN
30.0000 mg | Freq: Once | INTRAMUSCULAR | Status: AC
  Administered 2022-09-04: 30 mg via INTRAMUSCULAR

## 2022-09-04 NOTE — ED Provider Notes (Signed)
[facility]    CSN: [id number] Arrival date & time: 09/04/22  1300      History   Chief Complaint Chief Complaint  Patient presents with   Headache    HPI Madison Odonnell is a 47 y.o. female presenting for migraine headaches that have been coming and going for the past 3 to 4 days.  She says they do get better with NSAIDs but she is concerned that the doxycycline she is taking for a groin abscess has caused the migraines.  She says she has not had migraines since she was in her 2s.  She does have a history of thumbnail.  She says she gets photophobia and spotty vision when the migraine comes on.  She currently has a headache but says is a little better than it was earlier.  She has not taken anything for the migraine today.  She has never taken doxycycline before and thinks that could be responsible for this as noted.  She has missed last couple doses of the doxycycline.  She says she thinks the abscess has not gotten better and possibly has gotten a little worse.  No other concerns.  HPI  Past Medical History:  Diagnosis Date   Anxiety and depression     There are no problems to display for this patient.   History reviewed. No pertinent surgical history.  OB History   No obstetric history on file.      Home Medications    Prior to Admission medications   Medication Sig Start Date End Date Taking? Authorizing Provider  sulfamethoxazole-trimethoprim (BACTRIM DS) 800-160 MG tablet Take 1 tablet by mouth 2 (two) times daily for 7 days. 09/04/22 09/11/22 Yes Danton Clap, PA-C  buPROPion (WELLBUTRIN) 100 MG tablet Take 100 mg by mouth 2 (two) times daily.    [provider]  cyclobenzaprine (FLEXERIL) 5 MG tablet Take 1 tablet (5 mg total) by mouth 3 (three) times daily as needed for muscle spasms. 12/27/18   Edrick Kins, DPM  gabapentin (NEURONTIN) 100 MG capsule TAKE 1 CAPSULE BY MOUTH THREE TIMES A DAY 03/19/19   Edrick Kins, DPM  meloxicam  (MOBIC) 15 MG tablet Take 1 tablet (15 mg total) by mouth daily. 01/24/22   Edrick Kins, DPM  metroNIDAZOLE (FLAGYL) 500 MG tablet Take 1 tablet (500 mg total) by mouth 2 (two) times daily. 05/15/19   Karen Kitchens, NP  phenazopyridine (PYRIDIUM) 200 MG tablet Take 1 tablet (200 mg total) by mouth 3 (three) times daily as needed for pain. 05/15/19   Karen Kitchens, NP    Family History Family History  Problem Relation Age of Onset   Hypertension Mother    Diabetes Mother    Heart attack Mother     Social History Social History   Tobacco Use   Smoking status: Every Day    Packs/day: 0.50    Types: Cigarettes   Smokeless tobacco: Never  Substance Use Topics   Alcohol use: Yes   Drug use: No     Allergies   Patient has no known allergies.   Review of Systems Review of Systems  Constitutional:  Negative for fatigue and fever.  Eyes:  Positive for photophobia and visual disturbance.  Gastrointestinal:  Negative for abdominal pain.  Genitourinary:  Negative for vaginal pain.  Musculoskeletal:  Negative for neck pain.  Skin:  Positive for color change.  Neurological:  Positive for headaches. Negative for dizziness and weakness.  Physical Exam Triage Vital Signs ED Triage Vitals [09/04/22 1323]  Enc Vitals Group     BP (!) 156/98     Pulse Rate 98     Resp 16     Temp 98.5 F (36.9 C)     Temp Source Oral     SpO2 100 %     Weight      Height      Head Circumference      Peak Flow      Pain Score      Pain Loc      Pain Edu?      Excl. in GC?    No data found.  Updated Vital Signs BP (!) 156/98 (BP Location: Left Arm)   Pulse 98   Temp 98.5 F (36.9 C) (Oral)   Resp 16   SpO2 100%      Physical Exam Vitals and nursing note reviewed.  Constitutional:      General: She is not in acute distress.    Appearance: Normal appearance. She is not ill-appearing or toxic-appearing.  HENT:     Head: Normocephalic and atraumatic.     Nose: Nose normal.      Mouth/Throat:     Mouth: Mucous membranes are moist.     Pharynx: Oropharynx is clear.  Eyes:     General: No scleral icterus.       Right eye: No discharge.        Left eye: No discharge.     Extraocular Movements: Extraocular movements intact.     Conjunctiva/sclera: Conjunctivae normal.     Pupils: Pupils are equal, round, and reactive to light.  Cardiovascular:     Rate and Rhythm: Normal rate and regular rhythm.     Heart sounds: Normal heart sounds.  Pulmonary:     Effort: Pulmonary effort is normal. No respiratory distress.     Breath sounds: Normal breath sounds.  Musculoskeletal:     Cervical back: Neck supple.  Skin:    General: Skin is dry.     Comments: 2 cm x 2 cm area of erythema and induration left groin  Neurological:     General: No focal deficit present.     Mental Status: She is alert and oriented to person, place, and time. Mental status is at baseline.     Cranial Nerves: No cranial nerve deficit.     Motor: No weakness.     Gait: Gait normal.  Psychiatric:        Mood and Affect: Mood normal.        Behavior: Behavior normal.        Thought Content: Thought content normal.      UC Treatments / Results  Labs (all labs ordered are listed, but only abnormal results are displayed) Labs Reviewed - No data to display  EKG   Radiology No results found.  Procedures Procedures (including critical care time)  Medications Ordered in UC Medications  ketorolac (TORADOL) injection 30 mg (30 mg Intramuscular Given 09/04/22 1404)    Initial Impression / Assessment and Plan / UC Course  I have reviewed the triage vital signs and the nursing notes.  Pertinent labs & imaging results that were available during my care of the patient were reviewed by me and considered in my medical decision making (see chart for details).   47 year old female presents for migraines off and on for the past couple of days.  It does improve with NSAIDs.  She  is concerned it  could be related to the doxycycline because they started the time she started this antibiotic for groin abscess.  Normal cranial nerve exam today.  She has a little bit of photophobia.  Patient given 30 mg IM ketorolac to relieve the remainder of her headache.  Advise supportive care.  If migraine headaches continue advised to follow-up with PCP.  If they worsen or do not begin to improve advised to go to ER.  Will go ahead and switch her from doxycycline to Bactrim DS in case this is responsible for her migraines.  Advised her I am unsure.  Advised using warm compresses on the area and taking Tylenol as needed for discomfort.  Follow-up as needed.   Final Clinical Impressions(s) / UC Diagnoses   Final diagnoses:  Other migraine without status migrainosus, not intractable  Abscess of groin     Discharge Instructions      -Stop the doxycycline and start Bactrim DS.  This is a different antibiotic that should work.  You can also continue use warm compresses or warm soaks and Tylenol as needed for pain. - We have given you an injection of an anti-inflammatory medication to help relieve your headache.  May continue OTC meds as needed as well.  If headaches persist over the next couple days or worsen, go to ER.  Otherwise, follow-up with PCP.   ED Prescriptions     Medication Sig Dispense Auth. Provider   sulfamethoxazole-trimethoprim (BACTRIM DS) 800-160 MG tablet Take 1 tablet by mouth 2 (two) times daily for 7 days. 14 tablet Gretta Cool      PDMP not reviewed this encounter.   Danton Clap, PA-C 09/04/22 1410

## 2022-09-04 NOTE — ED Triage Notes (Signed)
Pt presents to the office for headaches x 1 week. Pt reports taking doxycycline for an abscess on her butt.  Pt reports she stop taking the doxycycline- she thinks this could the reason behind her headaches.

## 2022-09-04 NOTE — Discharge Instructions (Addendum)
-  Stop the doxycycline and start Bactrim DS.  This is a different antibiotic that should work.  You can also continue use warm compresses or warm soaks and Tylenol as needed for pain. - We have given you an injection of an anti-inflammatory medication to help relieve your headache.  May continue OTC meds as needed as well.  If headaches persist over the next couple days or worsen, go to ER.  Otherwise, follow-up with PCP.
